# Patient Record
Sex: Male | Born: 2000 | Race: Black or African American | Hispanic: No | Marital: Single | Smoking: Never smoker
Health system: Southern US, Community
[De-identification: ages and names within clinical notes are randomized; demographics above are authoritative.]

---
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
## 2022-10-16 ENCOUNTER — Emergency Department (HOSPITAL_COMMUNITY)
Admission: EM | Admit: 2022-10-16 | Discharge: 2022-10-16 | Disposition: A | Discharge status: Home / Self Care | Payer: Self-pay | Attending: Emergency Medicine | Admitting: Emergency Medicine

## 2022-10-16 ENCOUNTER — Other Ambulatory Visit: Payer: Self-pay

## 2022-10-16 ENCOUNTER — Encounter (HOSPITAL_COMMUNITY): Payer: Self-pay

## 2022-10-16 ENCOUNTER — Emergency Department (HOSPITAL_COMMUNITY): Payer: Self-pay

## 2022-10-16 DIAGNOSIS — W51XXXA Accidental striking against or bumped into by another person, initial encounter: Secondary | ICD-10-CM | POA: Insufficient documentation

## 2022-10-16 DIAGNOSIS — Y9372 Activity, wrestling: Secondary | ICD-10-CM | POA: Insufficient documentation

## 2022-10-16 DIAGNOSIS — S62652A Nondisplaced fracture of medial phalanx of right middle finger, initial encounter for closed fracture: Secondary | ICD-10-CM | POA: Insufficient documentation

## 2022-10-16 NOTE — ED Provider Notes (Signed)
[facility] Provider Note   CSN: [id number] Arrival date & time: 10/16/22  2041     History  Chief Complaint  Patient presents with   Finger Injury    Ryan Alexander is a 22 y.o. male presenting today with finger injury.  Reports that 2 days ago he was wrestling with his friend and that his right middle finger was pointing towards his thumb at the PIP.  Says that he was able to relocate it but he continues to have pain and swelling.  Wants to know whether or not it is broken.  No decrease in sensation or lacerations.  HPI     Home Medications Prior to Admission medications   Medication Sig Start Date End Date Taking? Authorizing Provider  acyclovir (ZOVIRAX) 800 MG tablet Take 1 tablet (800 mg total) by mouth 4 (four) times daily. 01/26/16   Ripley Fraise, MD  diphenhydrAMINE (BENADRYL) 25 mg capsule Take 25 mg by mouth every 6 (six) hours as needed.    [provider]  ibuprofen (ADVIL,MOTRIN) 600 MG tablet Take 1 tablet (600 mg total) by mouth every 6 (six) hours as needed for mild pain. 09/16/14   Isaac Bliss, MD  naproxen sodium (ANAPROX) 220 MG tablet Take 220 mg by mouth 2 (two) times daily with a meal.    [provider]      Allergies    Patient has no known allergies.    Review of Systems   Review of Systems  Physical Exam Updated Vital Signs BP 110/76   Pulse 79   Temp 97.8 F (36.6 C) (Oral)   Resp 18   Ht 6\' 4"  (1.93 m)   Wt 126.1 kg   SpO2 99%   BMI 33.84 kg/m  Physical Exam Vitals and nursing note reviewed.  Constitutional:      Appearance: Normal appearance.  HENT:     Head: Normocephalic and atraumatic.  Eyes:     General: No scleral icterus.    Conjunctiva/sclera: Conjunctivae normal.  Pulmonary:     Effort: Pulmonary effort is normal. No respiratory distress.  Musculoskeletal:     Comments: Full range of motion of all of the patient's digits.  Strong radial pulse and  normal cap refill in the right middle digit.  Does have some swelling along the PIP but no gross deformities.  Skin:    Findings: No rash.  Neurological:     Mental Status: He is alert.  Psychiatric:        Mood and Affect: Mood normal.     ED Results / Procedures / Treatments   Labs (all labs ordered are listed, but only abnormal results are displayed) Labs Reviewed - No data to display  EKG None  Radiology DG Hand Complete Right  Result Date: 10/16/2022 CLINICAL DATA:  Trauma, finger injury EXAM: RIGHT HAND - COMPLETE 3+ VIEW COMPARISON:  Right middle finger done today FINDINGS: There is a nondisplaced fracture through the anterior proximal corner of the right middle phalanx at the PIP joint. Overlying soft tissue swelling. No subluxation or dislocation. No additional fracture. IMPRESSION: Fracture through the anterior base of the right middle finger middle phalanx. Electronically Signed   By: Rolm Baptise M.D.   On: 10/16/2022 21:20   DG Finger Middle Right  Result Date: 10/16/2022 CLINICAL DATA:  Finger injury. EXAM: RIGHT MIDDLE FINGER 2+V COMPARISON:  None Available. FINDINGS: There is a nondisplaced fracture through the anterior proximal corner of the  right middle phalanx at the PIP joint. Overlying soft tissue swelling. No subluxation or dislocation. IMPRESSION: Fracture through the anterior base of the right middle finger middle phalanx. Electronically Signed   By: Rolm Baptise M.D.   On: 10/16/2022 21:20    Procedures Procedures   Medications Ordered in ED Medications - No data to display  ED Course/ Medical Decision Making/ A&P                             Medical Decision Making Amount and/or Complexity of Data Reviewed Radiology: ordered.   22 year old male presenting today with a finger injury that occurred 2 days ago.  There was no laceration/no open fracture.  Neurovascularly intact and still able to range all the joints of the digit.  X-ray viewed and  interpreted by me and it is revealing of a fracture at the base of the middle phalanx.  I suspect that the patient also had a dislocation at the PIP however successfully relocated it.  Possibly causing the fracture.  Regardless, patient is neurovascularly intact.  He already had a finger splint however it is small so he was given an appropriately sized splint.  He will need to follow-up with PCP.  His PCP is in [place] so if he is unable to see them soon he will follow-up with the hand surgeon in [place].  Declined any medications today and will use Tylenol and ibuprofen outpatient.  Provided with a work note.   Final Clinical Impression(s) / ED Diagnoses Final diagnoses:  Closed nondisplaced fracture of middle phalanx of right middle finger, initial encounter    Rx / DC Orders ED Discharge Orders     None      Results and diagnoses were explained to the patient. Return precautions discussed in full. Patient had no additional questions and expressed complete understanding.   This chart was dictated using voice recognition software.  Despite best efforts to proofread,  errors can occur which can change the documentation meaning.    Darliss Ridgel 10/16/22 2216    Valarie Merino, MD 10/16/22 2330

## 2022-10-16 NOTE — Discharge Instructions (Signed)
You came to the emergency department with a concern for finger injury.  You do have a small fracture in the middle of your finger.  You may use Tylenol 500 mg alternated with ibuprofen 800 mg every 3 hours.  Make an appointment with your PCP and if you are unable to get in with them or are in [place], call the hand physician attached to these discharge papers for reevaluation in 1 week.  It was a pleasure to meet you and we hope you feel better.  Your work note is attached.

## 2022-10-16 NOTE — ED Triage Notes (Signed)
Pt arrived to triage complaining of right middle finger injury that started 2 days ago, pt state he was messing around with a friend and his finger popped out. Pt pushed it back into place and the put a splint on it  Pt states that today pain and swelling has increased and OTC meds are no longer helping

## 2023-02-13 ENCOUNTER — Encounter (HOSPITAL_COMMUNITY): Payer: Self-pay

## 2023-02-13 ENCOUNTER — Ambulatory Visit (INDEPENDENT_AMBULATORY_CARE_PROVIDER_SITE_OTHER): Payer: Self-pay

## 2023-02-13 ENCOUNTER — Ambulatory Visit (HOSPITAL_COMMUNITY)
Admission: EM | Admit: 2023-02-13 | Discharge: 2023-02-13 | Disposition: A | Discharge status: Home / Self Care | Payer: Self-pay | Attending: Internal Medicine | Admitting: Internal Medicine

## 2023-02-13 DIAGNOSIS — S6991XA Unspecified injury of right wrist, hand and finger(s), initial encounter: Secondary | ICD-10-CM

## 2023-02-13 DIAGNOSIS — S62642A Nondisplaced fracture of proximal phalanx of right middle finger, initial encounter for closed fracture: Secondary | ICD-10-CM

## 2023-02-13 DIAGNOSIS — Y9367 Activity, basketball: Secondary | ICD-10-CM

## 2023-02-13 NOTE — ED Triage Notes (Signed)
Here for right middle finger pain x 1 month. Pt stated he was pulling basketball and reach for the ball and injured his finger.  Pt has been wearing a splint on his finger.  Pt reports he cannot move his finger.

## 2023-02-13 NOTE — ED Provider Notes (Signed)
[facility]    CSN: [id number] Arrival date & time: 02/13/23  1237      History   Chief Complaint Chief Complaint  Patient presents with   Finger Injury    HPI Ryan Alexander is a 22 y.o. male.   Patient presents to urgent care for evaluation of right middle finger pain that started 1 month ago. Patient was playing basketball and went for a layup when the ball accidentally struck the tip of the right middle digit causing the finger to bend laterally towards the thumb. He immediately experienced pain and swelling to the right middle finger and has kept it splinted for the last month with the finger extended. Experiencing most pain to the medial DIP joint. States he is unable to bend the finger at the DIP joint due to severe pain, assumes it is dislocated. Denies prior injury to the affected digit. No numbness or tingling distally. Took tylenol when this first happened one month ago. If he accidentally hits the hand, the finger will hurt temporarily but then the pain will go away.      History reviewed. No pertinent past medical history.  There are no problems to display for this patient.   History reviewed. No pertinent surgical history.     Home Medications    Prior to Admission medications   Medication Sig Start Date End Date Taking? Authorizing Provider  acyclovir (ZOVIRAX) 800 MG tablet Take 1 tablet (800 mg total) by mouth 4 (four) times daily. 01/26/16   Zadie Rhine, MD  diphenhydrAMINE (BENADRYL) 25 mg capsule Take 25 mg by mouth every 6 (six) hours as needed.    [provider]  ibuprofen (ADVIL,MOTRIN) 600 MG tablet Take 1 tablet (600 mg total) by mouth every 6 (six) hours as needed for mild pain. 09/16/14   Marcellina Millin, MD  naproxen sodium (ANAPROX) 220 MG tablet Take 220 mg by mouth 2 (two) times daily with a meal.    [provider]    Family History History reviewed. No pertinent family history.  Social History Social  History   Tobacco Use   Smoking status: Never   Smokeless tobacco: Never     Allergies   Patient has no known allergies.   Review of Systems Review of Systems Per HPI  Physical Exam Triage Vital Signs ED Triage Vitals [02/13/23 1406]  Encounter Vitals Group     BP (!) 145/95     Systolic BP Percentile      Diastolic BP Percentile      Pulse Rate 74     Resp 16     Temp 99.2 F (37.3 C)     Temp Source Oral     SpO2 98 %     Weight      Height      Head Circumference      Peak Flow      Pain Score      Pain Loc      Pain Education      Exclude from Growth Chart    No data found.  Updated Vital Signs BP (!) 145/95 (BP Location: Left Arm)   Pulse 74   Temp 99.2 F (37.3 C) (Oral)   Resp 16   SpO2 98%   Visual Acuity Right Eye Distance:   Left Eye Distance:   Bilateral Distance:    Right Eye Near:   Left Eye Near:    Bilateral Near:     Physical Exam Vitals  and nursing note reviewed.  Constitutional:      Appearance: He is not ill-appearing or toxic-appearing.  HENT:     Head: Normocephalic and atraumatic.     Right Ear: Hearing and external ear normal.     Left Ear: Hearing and external ear normal.     Nose: Nose normal.     Mouth/Throat:     Lips: Pink.  Eyes:     General: Lids are normal. Vision grossly intact. Gaze aligned appropriately.     Extraocular Movements: Extraocular movements intact.     Conjunctiva/sclera: Conjunctivae normal.  Pulmonary:     Effort: Pulmonary effort is normal.  Musculoskeletal:     Right hand: Swelling, deformity (subtle deformity to the PIP joint of the right middle finger), tenderness (PIP joint of right middle finger) and bony tenderness present. No lacerations. Decreased range of motion (right middle finger kept in extended position at the PIP joint. DIP joint with full ROM.). Normal strength. Normal sensation (Full sensation to distal affected digit). Normal capillary refill. Normal pulse (+2 right radial  pulse).     Left hand: Normal.     Cervical back: Neck supple.     Comments: Splint present on exam with coban wrap initially. Severely decreased ROM of the right middle finger at the PIP joint. Right middle finger stuck in extended position at the PIP joint, I am unable to manipulate finger to get into flexed position at the PIP joint. Neurovascularly intact distally.   Skin:    General: Skin is warm and dry.     Capillary Refill: Capillary refill takes less than 2 seconds.     Findings: No rash.  Neurological:     General: No focal deficit present.     Mental Status: He is alert and oriented to person, place, and time. Mental status is at baseline.     Cranial Nerves: No dysarthria or facial asymmetry.  Psychiatric:        Mood and Affect: Mood normal.        Speech: Speech normal.        Behavior: Behavior normal.        Thought Content: Thought content normal.        Judgment: Judgment normal.      UC Treatments / Results  Labs (all labs ordered are listed, but only abnormal results are displayed) Labs Reviewed - No data to display  EKG   Radiology DG Hand Complete Right  Result Date: 02/13/2023 CLINICAL DATA:  Right middle finger pain for 1 month. Finger injury playing basketball. Decreased range of motion. EXAM: RIGHT HAND - COMPLETE 3+ VIEW COMPARISON:  Radiographs 10/16/2022. FINDINGS: Interval development of periarticular osteopenia and mild joint space narrowing at the 3rd proximal interphalangeal joint. The previously demonstrated intra-articular fracture involving the anterior base of the 3rd middle phalanx appears at least partially healed on the lateral view. There is no evidence of acute fracture, dislocation or focal erosive change. Previously demonstrated soft tissue swelling has resolved. IMPRESSION: 1. Interval development of periarticular osteopenia and mild joint space narrowing at the 3rd proximal interphalangeal joint. Findings are nonspecific but could be due  to inflammatory arthritis. 2. Partially healed intra-articular fracture at the base of the 3rd middle phalanx. 3. Consider follow-up MRI for further assessment, especially if concern for tendon or ligament injury. Electronically Signed   By: Carey Bullocks M.D.   On: 02/13/2023 15:07    Procedures Procedures (including critical care time)  Medications Ordered in UC  Medications - No data to display  Initial Impression / Assessment and Plan / UC Course  I have reviewed the triage vital signs and the nursing notes.  Pertinent labs & imaging results that were available during my care of the patient were reviewed by me and considered in my medical decision making (see chart for details).   1. Injury of right middle finger, closed nondisplaced fracture of proximal phalanx of right middle finger, injury while playing basketball Severe decreased ROM of the affected digit. Neurovascularly intact distally to injury. Consulted hand surgery due to severe decreased ROM with associated subacute/healing fracture. Spoke with Earney Hamburg PA who recommends finger splint and close follow-up in the office with Dr. Yehuda Budd hand surgeon in the next couple of days for further evaluation and next steps.  May continue taking OTC medicines as needed for pain.  Work excuse note given.  Counseled patient on potential for adverse effects with medications prescribed/recommended today, strict ER and return-to-clinic precautions discussed, patient verbalized understanding.    Final Clinical Impressions(s) / UC Diagnoses   Final diagnoses:  Injury of right middle finger, initial encounter  Closed nondisplaced fracture of proximal phalanx of right middle finger, initial encounter  Injury while playing basketball     Discharge Instructions      Your finger is dislocated and there is a healing fracture. Wear the finger splint daily. Please schedule an appointment with Dr. Yehuda Budd hand surgeon for a soon as  possible to discuss how we will manage this further.  Take tylenol as needed for pain.  If you develop any new or worsening symptoms or if your symptoms do not start to improve, pleases return here or follow-up with your primary care provider. If your symptoms are severe, please go to the emergency room.     ED Prescriptions   None    PDMP not reviewed this encounter.   Carlisle Beers, FNP 02/13/23 2200

## 2023-02-13 NOTE — Discharge Instructions (Signed)
Your finger is dislocated and there is a healing fracture. Wear the finger splint daily. Please schedule an appointment with Dr. Yehuda Budd hand surgeon for a soon as possible to discuss how we will manage this further.  Take tylenol as needed for pain.  If you develop any new or worsening symptoms or if your symptoms do not start to improve, pleases return here or follow-up with your primary care provider. If your symptoms are severe, please go to the emergency room.
